# Patient Record
Sex: Male | Born: 1980 | Hispanic: No | Marital: Married
Health system: Southern US, Community
[De-identification: ages and names within clinical notes are randomized; demographics above are authoritative.]

## PROBLEM LIST (undated history)

## (undated) DIAGNOSIS — T7840XA Allergy, unspecified, initial encounter: Secondary | ICD-10-CM

## (undated) DIAGNOSIS — F419 Anxiety disorder, unspecified: Secondary | ICD-10-CM

## (undated) HISTORY — DX: Anxiety disorder, unspecified: F41.9

## (undated) HISTORY — DX: Allergy, unspecified, initial encounter: T78.40XA

---
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
## 2000-09-11 ENCOUNTER — Emergency Department (HOSPITAL_COMMUNITY): Admission: EM | Admit: 2000-09-11 | Discharge: 2000-09-11 | Payer: Self-pay | Admitting: Emergency Medicine

## 2001-07-06 ENCOUNTER — Emergency Department (HOSPITAL_COMMUNITY): Admission: EM | Admit: 2001-07-06 | Discharge: 2001-07-06 | Payer: Self-pay | Admitting: Emergency Medicine

## 2008-06-20 ENCOUNTER — Encounter: Admission: RE | Admit: 2008-06-20 | Discharge: 2008-06-20 | Payer: Self-pay | Admitting: Family Medicine

## 2010-03-07 IMAGING — CR DG TIBIA/FIBULA 2V*L*
1 series · 4 of 4 positions shown · non-contrast
Comparison: None.

CLINICAL DATA: Injured mid tibial 1 week ago.  Large hematoma in
the lower leg.

LEFT TIBIA AND FIBULA - 2 VIEW

[Series 1: view not recorded · 0.17mm/px · 4 of 4 slices shown]
[im 1/4]
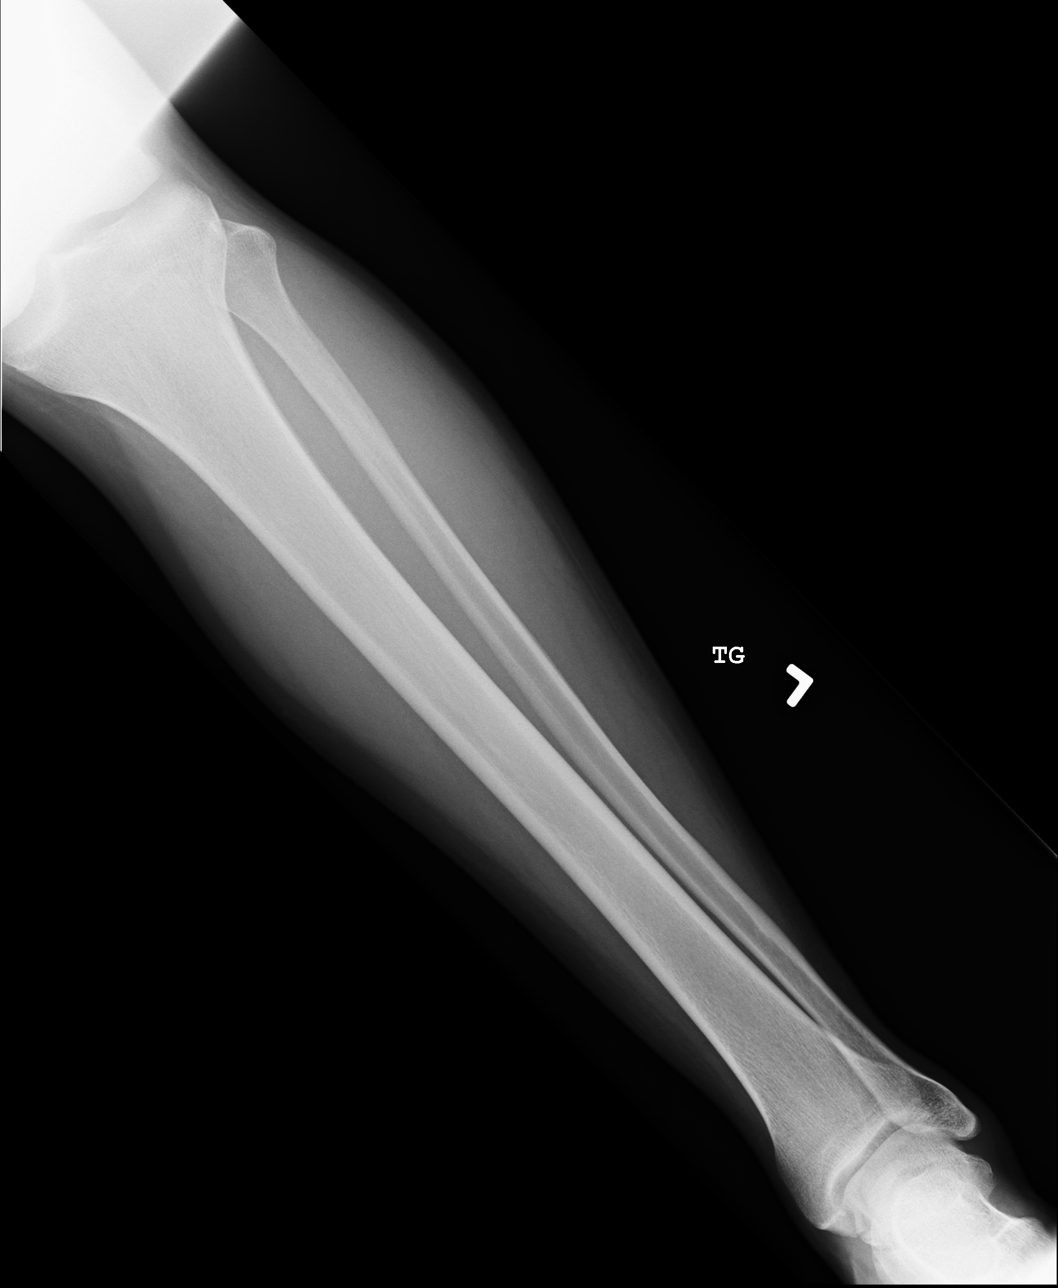
[im 2/4]
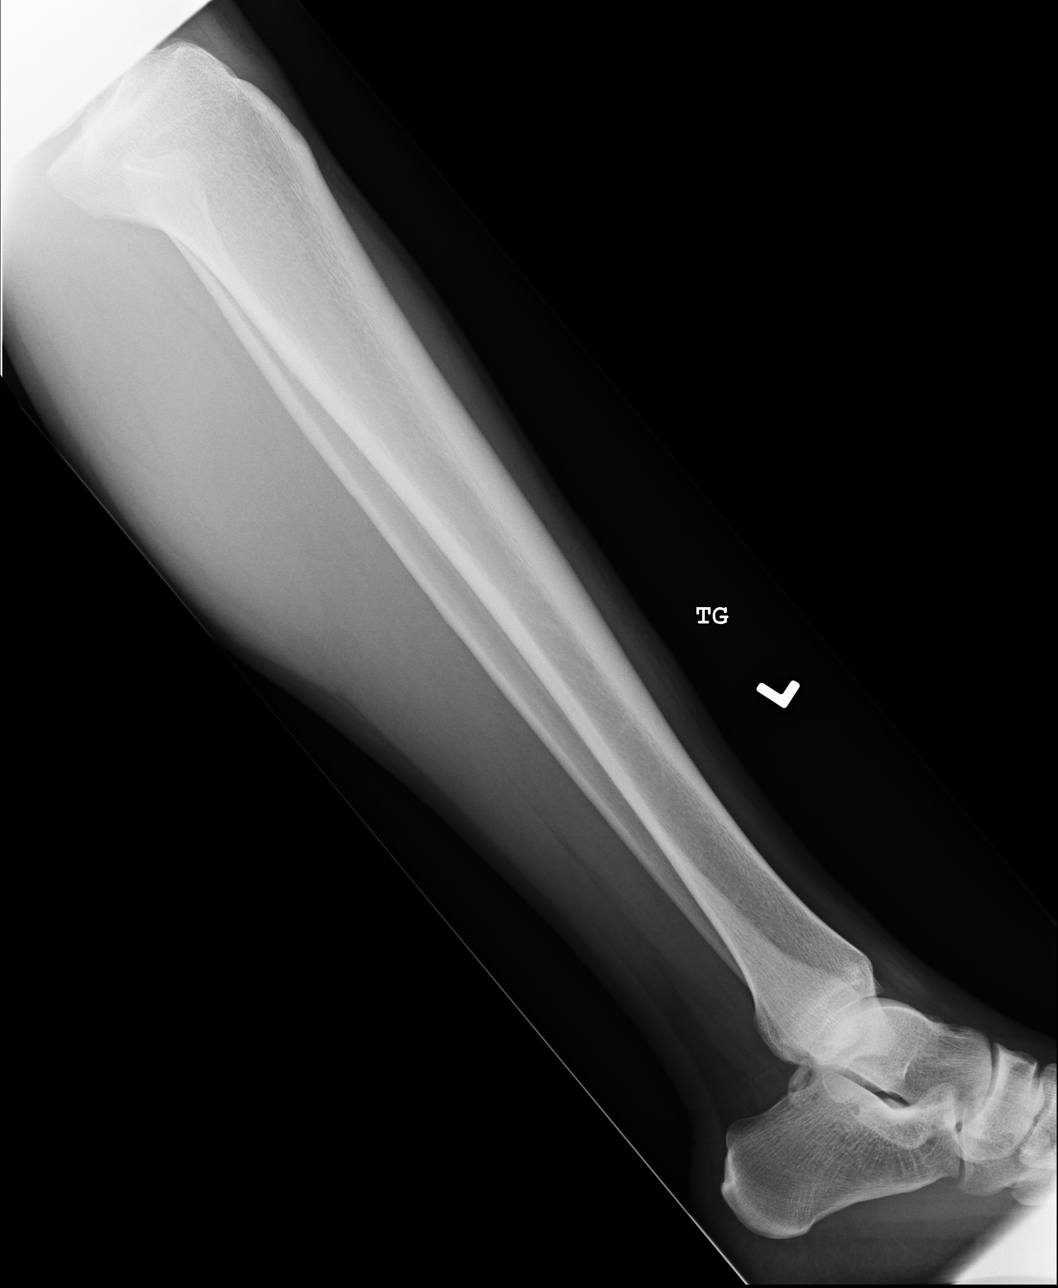
[im 3/4]
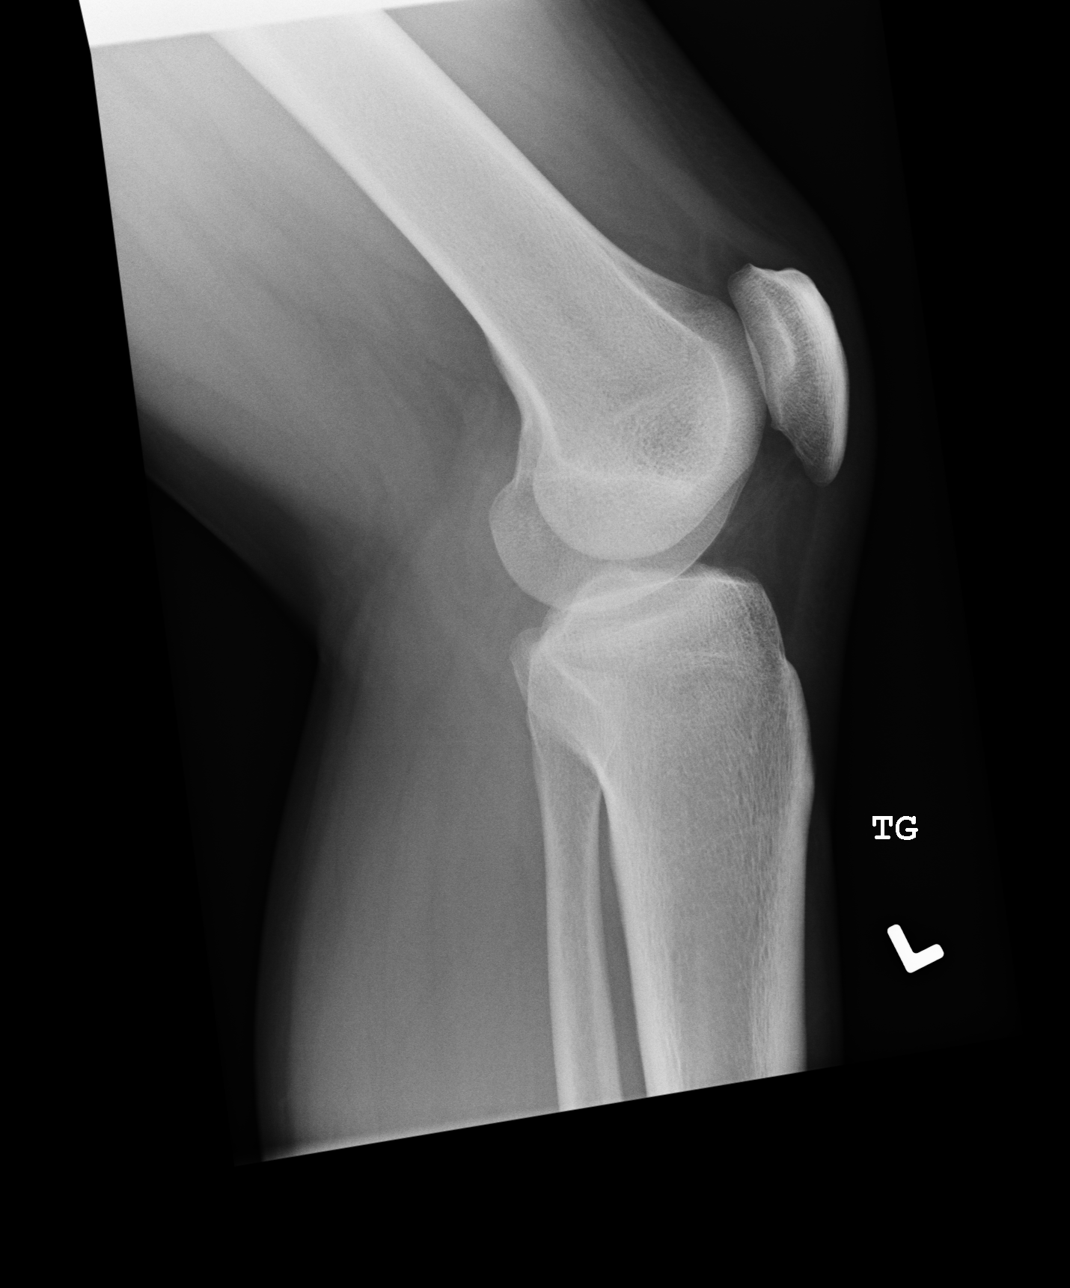
[im 4/4]
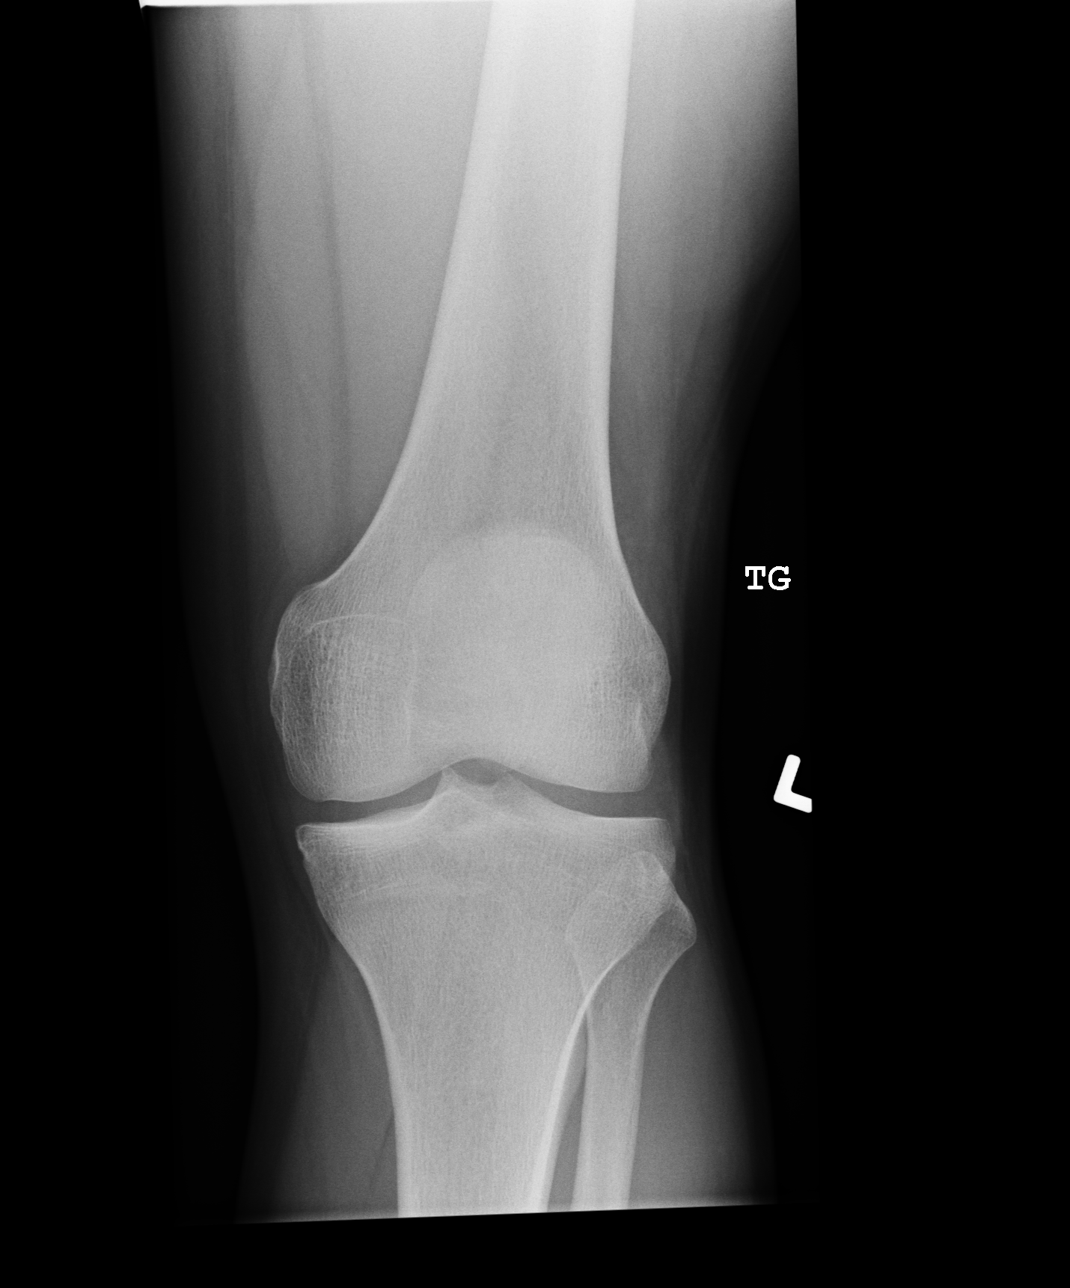

[4 of 4 positions shown; findings below may reference images not displayed]

FINDINGS: No evidence for fracture in the tibia or fibula.  No
focal lytic or sclerotic osseous lesion.  There is some mild soft
tissue fullness in the anterior aspect the distal leg.  No
underlying soft tissue gas.
IMPRESSION: No acute bony abnormality.

## 2010-09-20 ENCOUNTER — Inpatient Hospital Stay (INDEPENDENT_AMBULATORY_CARE_PROVIDER_SITE_OTHER)
Admission: RE | Admit: 2010-09-20 | Discharge: 2010-09-20 | Disposition: A | Discharge status: Home / Self Care | Payer: BC Managed Care – PPO | Source: Ambulatory Visit | Attending: Family Medicine | Admitting: Family Medicine

## 2010-09-20 ENCOUNTER — Ambulatory Visit (INDEPENDENT_AMBULATORY_CARE_PROVIDER_SITE_OTHER): Payer: BC Managed Care – PPO

## 2010-09-20 DIAGNOSIS — S6990XA Unspecified injury of unspecified wrist, hand and finger(s), initial encounter: Secondary | ICD-10-CM

## 2010-09-20 DIAGNOSIS — S6980XA Other specified injuries of unspecified wrist, hand and finger(s), initial encounter: Secondary | ICD-10-CM

## 2012-12-29 ENCOUNTER — Telehealth: Payer: Self-pay

## 2012-12-29 NOTE — Telephone Encounter (Signed)
Patient is calling about his lab results please call him at [phone]

## 2012-12-29 NOTE — Telephone Encounter (Signed)
What labs? I do not see any labs on him. Left message.

## 2015-05-02 ENCOUNTER — Ambulatory Visit (INDEPENDENT_AMBULATORY_CARE_PROVIDER_SITE_OTHER): Payer: 59 | Admitting: Internal Medicine

## 2015-05-02 VITALS — BP 110/80 | HR 61 | Temp 98.7°F | Resp 18 | Ht 68.0 in | Wt 211.4 lb

## 2015-05-02 DIAGNOSIS — F418 Other specified anxiety disorders: Secondary | ICD-10-CM

## 2015-05-02 DIAGNOSIS — R454 Irritability and anger: Secondary | ICD-10-CM | POA: Diagnosis not present

## 2015-05-02 MED ORDER — SERTRALINE HCL 50 MG PO TABS: 50.0000 mg | ORAL_TABLET | Freq: Every day | ORAL | Status: DC

## 2015-05-02 NOTE — Progress Notes (Signed)
Subjective:    Patient ID: Gary Freeman, male    DOB: 10-06-1980, 34 y.o.   MRN: [id number] This chart was scribed for Ellamae Siaobert Doolittle, MD by Jolene Provostobert Halas, Medical Scribe. This patient was seen in Room 12 and the patient's care was started a 7:24 PM.  Chief Complaint  Patient presents with  . Medication Refill    setraline    HPI HPI Comments: Gary Freeman is a 34 y.o. male [facility] medication refill - setraline. He states he stopped taking the medication six months ago. He states his family has commented that he has been more angry and harder to be around since he stopped the medication. He is not married. He lives at home with his family. He was started on this medication 1 1/2 years ago by someone who felt he needed better anger management. He felt no response to the medication and so decided to discontinue it when he ran out of the prescription one year later. He has little follow-up for healthcare in general. He has been healthy in general.  He describes a family history with everyone having depression and irritability. His sister-in-law has commented on his abnormally angry behavior since he discontinued his medication. 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 He does not describe and hedonism or loss of motivation. He has no trouble sleeping. There is not excessive anxiety. He does worry excessively.  Past Medical History  Diagnosis Date  . Allergy   . Anxiety    No current outpatient prescriptions on file prior to visit.   No current facility-administered medications on file prior to visit.     Review of Systems  Constitutional: Negative for fever and chills.  Psychiatric/Behavioral: Positive for behavioral problems and agitation.      Objective:  BP 110/80 mmHg  Pulse 61  Temp(Src) 98.7 F (37.1 C) (Oral)  Resp 18  Ht 5\' 8"  (1.727 m)  Wt 211 lb 6.4 oz (95.89 kg)  BMI 32.15  kg/m2  SpO2 98%  Physical Exam  Constitutional: He is oriented to person, place, and time. He appears well-developed and well-nourished. No distress.  HENT:  Head: Normocephalic and atraumatic.  Eyes: EOM are normal. Pupils are equal, round, and reactive to light.  Neck: Neck supple.  Cardiovascular: Normal rate.   Pulmonary/Chest: Effort normal.  Musculoskeletal: Normal range of motion.  Neurological: He is alert and oriented to person, place, and time. No cranial nerve deficit.  Skin: Skin is warm and dry. He is not diaphoretic.  Psychiatric: He has a normal mood and affect. His behavior is normal. Thought content normal.  Nursing note and vitals reviewed.     Assessment & Plan:  Depression with anxiety  Difficulty controlling anger  Meds ordered this encounter  Medications  . DISCONTD: sertraline (ZOLOFT) 25 MG tablet    Sig: Take 25 mg by mouth daily.  . sertraline (ZOLOFT) 50 MG tablet    Sig: Take 1 tablet (50 mg total) by mouth daily.    Dispense:  30 tablet    Refill:  3   Follow-up 4-6 weeks At that visit I will establish follow-up by appointment for ongoing medical care  I have completed the patient encounter in its entirety as documented by the scribe, with editing by me where necessary. Robert P. Merla Richesoolittle, M.D.   By signing my name below, I, Javier Dockerobert Ryan Halas, attest that this documentation has  been prepared under the direction and in the presence of Ellamae Sia, MD. Electronically Signed: Javier Docker, ER Scribe. 05/02/2015. 7:25 PM.

## 2015-05-03 DIAGNOSIS — R454 Irritability and anger: Secondary | ICD-10-CM | POA: Insufficient documentation

## 2015-05-03 DIAGNOSIS — F418 Other specified anxiety disorders: Secondary | ICD-10-CM | POA: Insufficient documentation

## 2015-07-30 ENCOUNTER — Other Ambulatory Visit: Payer: Self-pay | Admitting: Internal Medicine

## 2015-08-25 ENCOUNTER — Other Ambulatory Visit: Payer: Self-pay | Admitting: Internal Medicine

## 2018-03-24 DIAGNOSIS — S60947A Unspecified superficial injury of left little finger, initial encounter: Secondary | ICD-10-CM | POA: Diagnosis not present

## 2018-10-09 DIAGNOSIS — Z20828 Contact with and (suspected) exposure to other viral communicable diseases: Secondary | ICD-10-CM | POA: Diagnosis not present
